# Patient Record
Sex: Female | Born: 1995 | Race: Black or African American | Hispanic: No | Marital: Single | Smoking: Never smoker
Health system: Southern US, Community
[De-identification: ages and names within clinical notes are randomized; demographics above are authoritative.]

## PROBLEM LIST (undated history)

## (undated) DIAGNOSIS — M1990 Unspecified osteoarthritis, unspecified site: Secondary | ICD-10-CM

## (undated) DIAGNOSIS — M329 Systemic lupus erythematosus, unspecified: Secondary | ICD-10-CM

## (undated) DIAGNOSIS — IMO0002 Reserved for concepts with insufficient information to code with codable children: Secondary | ICD-10-CM

## (undated) HISTORY — PX: NO PAST SURGERIES: SHX2092

---
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
## 2015-01-01 ENCOUNTER — Emergency Department (HOSPITAL_COMMUNITY): Payer: Medicaid Other

## 2015-01-01 ENCOUNTER — Encounter (HOSPITAL_COMMUNITY): Payer: Self-pay | Admitting: *Deleted

## 2015-01-01 ENCOUNTER — Emergency Department (HOSPITAL_COMMUNITY)
Admission: EM | Admit: 2015-01-01 | Discharge: 2015-01-02 | Disposition: A | Discharge status: Home / Self Care | Payer: Medicaid Other | Attending: Emergency Medicine | Admitting: Emergency Medicine

## 2015-01-01 DIAGNOSIS — R0789 Other chest pain: Secondary | ICD-10-CM | POA: Insufficient documentation

## 2015-01-01 DIAGNOSIS — R05 Cough: Secondary | ICD-10-CM | POA: Insufficient documentation

## 2015-01-01 DIAGNOSIS — R0602 Shortness of breath: Secondary | ICD-10-CM | POA: Insufficient documentation

## 2015-01-01 DIAGNOSIS — Z872 Personal history of diseases of the skin and subcutaneous tissue: Secondary | ICD-10-CM | POA: Diagnosis not present

## 2015-01-01 DIAGNOSIS — O9989 Other specified diseases and conditions complicating pregnancy, childbirth and the puerperium: Secondary | ICD-10-CM | POA: Insufficient documentation

## 2015-01-01 DIAGNOSIS — Z8739 Personal history of other diseases of the musculoskeletal system and connective tissue: Secondary | ICD-10-CM | POA: Diagnosis not present

## 2015-01-01 DIAGNOSIS — Z3A16 16 weeks gestation of pregnancy: Secondary | ICD-10-CM | POA: Insufficient documentation

## 2015-01-01 HISTORY — DX: Unspecified osteoarthritis, unspecified site: M19.90

## 2015-01-01 HISTORY — DX: Reserved for concepts with insufficient information to code with codable children: IMO0002

## 2015-01-01 HISTORY — DX: Systemic lupus erythematosus, unspecified: M32.9

## 2015-01-01 LAB — CBC WITH DIFFERENTIAL/PLATELET
BASOS PCT: 0 % (ref 0–1)
Basophils Absolute: 0 10*3/uL (ref 0.0–0.1)
Eosinophils Absolute: 0.1 10*3/uL (ref 0.0–0.7)
Eosinophils Relative: 1 % (ref 0–5)
HCT: 34.2 % — ABNORMAL LOW (ref 36.0–46.0)
HEMOGLOBIN: 11.8 g/dL — AB (ref 12.0–15.0)
Lymphocytes Relative: 32 % (ref 12–46)
Lymphs Abs: 2.5 10*3/uL (ref 0.7–4.0)
MCH: 31.2 pg (ref 26.0–34.0)
MCHC: 34.5 g/dL (ref 30.0–36.0)
MCV: 90.5 fL (ref 78.0–100.0)
MONO ABS: 0.8 10*3/uL (ref 0.1–1.0)
MONOS PCT: 10 % (ref 3–12)
NEUTROS ABS: 4.5 10*3/uL (ref 1.7–7.7)
NEUTROS PCT: 57 % (ref 43–77)
Platelets: 169 10*3/uL (ref 150–400)
RBC: 3.78 MIL/uL — ABNORMAL LOW (ref 3.87–5.11)
RDW: 13.9 % (ref 11.5–15.5)
WBC: 8 10*3/uL (ref 4.0–10.5)

## 2015-01-01 LAB — I-STAT CHEM 8, ED
BUN: 4 mg/dL — ABNORMAL LOW (ref 6–20)
CALCIUM ION: 1.27 mmol/L — AB (ref 1.12–1.23)
CHLORIDE: 101 mmol/L (ref 101–111)
Creatinine, Ser: 0.5 mg/dL (ref 0.44–1.00)
GLUCOSE: 117 mg/dL — AB (ref 65–99)
HCT: 36 % (ref 36.0–46.0)
Hemoglobin: 12.2 g/dL (ref 12.0–15.0)
POTASSIUM: 3 mmol/L — AB (ref 3.5–5.1)
Sodium: 137 mmol/L (ref 135–145)
TCO2: 22 mmol/L (ref 0–100)

## 2015-01-01 LAB — D-DIMER, QUANTITATIVE (NOT AT ARMC): D DIMER QUANT: 1.78 ug{FEU}/mL — AB (ref 0.00–0.48)

## 2015-01-01 MED ORDER — SODIUM CHLORIDE 0.9 % IV BOLUS (SEPSIS)
500.0000 mL | Freq: Once | INTRAVENOUS | Status: AC
  Administered 2015-01-01: 500 mL via INTRAVENOUS

## 2015-01-01 MED ORDER — IOHEXOL 350 MG/ML SOLN
100.0000 mL | Freq: Once | INTRAVENOUS | Status: AC | PRN
  Administered 2015-01-01: 100 mL via INTRAVENOUS

## 2015-01-01 MED ORDER — POTASSIUM CHLORIDE CRYS ER 20 MEQ PO TBCR
40.0000 meq | EXTENDED_RELEASE_TABLET | Freq: Once | ORAL | Status: AC
  Administered 2015-01-01: 40 meq via ORAL
  Filled 2015-01-01: qty 2

## 2015-01-01 NOTE — ED Notes (Signed)
Pt states that she is 4 almost 5 months pregnant. States that for 3 days she has been experiencing SOB. 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

## 2015-01-01 NOTE — ED Notes (Signed)
EKG completed in triage.

## 2015-01-01 NOTE — ED Provider Notes (Addendum)
CSN: [id number]     Arrival date & time 01/01/15  1831 History   First MD Initiated Contact with Patient 01/01/15 2152     Chief Complaint  Patient presents with  . Shortness of Breath     (Consider location/radiation/quality/duration/timing/severity/associated sxs/prior Treatment) Patient is a 19 y.o. female presenting with shortness of breath. The history is provided by the patient.  Shortness of Breath Severity:  Mild Onset quality:  Unable to specify Duration:  3 days Timing:  Constant Progression:  Unchanged Chronicity:  New Relieved by:  None tried Worsened by:  Nothing tried Ineffective treatments:  None tried Associated symptoms: chest pain and cough   Associated symptoms: no abdominal pain, no fever, no hemoptysis, no neck pain, no rash, no vomiting and no wheezing   Chest pain:    Quality:  Aching   Severity:  Mild   Onset quality:  Unable to specify   Duration:  3 days   Timing:  Constant   Progression:  Unchanged   Chronicity:  New   Past Medical History  Diagnosis Date  . Lupus   . Arthritis    History reviewed. No pertinent past surgical history. No family history on file. History  Substance Use Topics  . Smoking status: Never Smoker   . Smokeless tobacco: Not on file  . Alcohol Use: No   OB History    Gravida Para Term Preterm AB TAB SAB Ectopic Multiple Living   1              Review of Systems  Constitutional: Negative for fever.  Respiratory: Positive for cough and shortness of breath. Negative for hemoptysis and wheezing.   Cardiovascular: Positive for chest pain.  Gastrointestinal: Negative for nausea, vomiting, abdominal pain, diarrhea and constipation.  Genitourinary: Negative for vaginal bleeding and vaginal discharge.  Musculoskeletal: Negative for back pain and neck pain.  Skin: Negative for rash.  All other systems reviewed and are negative.     Allergies  Review of patient's allergies indicates no known allergies.  Home  Medications   Prior to Admission medications   Medication Sig Start Date End Date Taking? Authorizing Provider  Prenatal Vit-Fe Fumarate-FA (PREPLUS) 27-1 MG TABS Take 1 tablet by mouth daily. 12/05/14  Yes Historical Provider, MD   BP 98/49 mmHg  Pulse 72  Temp(Src) 98.5 F (36.9 C) (Oral)  Resp 22  SpO2 100%  LMP  Physical Exam  Constitutional: She appears well-developed.  HENT:  Head: Normocephalic.  Right Ear: External ear normal.  Left Ear: External ear normal.  Mouth/Throat: Oropharynx is clear and moist.  Eyes: Pupils are equal, round, and reactive to light.  Neck: Normal range of motion.  Pulmonary/Chest: Effort normal and breath sounds normal. No respiratory distress. She has no wheezes. She exhibits no tenderness.  Abdominal: Soft.  Musculoskeletal: Normal range of motion.  Neurological: She is alert.  Skin: Skin is warm and dry.  Vitals reviewed.   ED Course  Procedures (including critical care time) Labs Review Labs Reviewed  CBC WITH DIFFERENTIAL/PLATELET - Abnormal; Notable for the following:    RBC 3.78 (*)    Hemoglobin 11.8 (*)    HCT 34.2 (*)    All other components within normal limits  D-DIMER, QUANTITATIVE (NOT AT [facility]) - Abnormal; Notable for the following:    D-Dimer, Quant 1.78 (*)    All other components within normal limits  I-STAT CHEM 8, ED - Abnormal; Notable for the following:    Potassium 3.0 (*)  BUN 4 (*)    Glucose, Bld 117 (*)    Calcium, Ion 1.27 (*)    All other components within normal limits    Imaging Review Ct Angio Chest Pe W/cm &/or Wo Cm  01/02/2015   CLINICAL DATA:  Three days dyspnea.  History of lupus.  EXAM: CT ANGIOGRAPHY CHEST WITH CONTRAST  TECHNIQUE: Multidetector CT imaging of the chest was performed using the standard protocol during bolus administration of intravenous contrast. Multiplanar CT image reconstructions and MIPs were obtained to evaluate the vascular anatomy.  CONTRAST:  OMNIPAQUE IOHEXOL 350  MG/ML SOLN  COMPARISON:  None.  FINDINGS: Cardiovascular: There is good opacification of the pulmonary arteries. There is no pulmonary embolism. The thoracic aorta is normal in caliber and intact.  Lungs: Clear  Central airways: Patent  Effusions: None  Lymphadenopathy: None  Esophagus: Unremarkable  Upper abdomen: No significant abnormality  Musculoskeletal: No significant abnormality  Review of the MIP images confirms the above findings.  IMPRESSION: Negative for pulmonary embolism.  No significant abnormality.   Electronically Signed   By: Ellery Plunk M.D.   On: 01/02/2015 00:04     EKG Interpretation   Date/Time:  Tuesday January 01 2015 19:09:05 EDT Ventricular Rate:  98 PR Interval:  152 QRS Duration: 106 QT Interval:  348 QTC Calculation: 444 R Axis:   73 Text Interpretation:  Normal sinus rhythm Incomplete right bundle branch  block Nonspecific T wave abnormality Abnormal ECG No previous ECGs  available Confirmed by Bebe Shaggy  MD, Dorinda Hill ([id number]) on 01/01/2015 11:35:25  PM     Patient's d-dimer is elevated at 1.78, even corrected for pregnancy.  It is elevated, she will be set up for a chest CT anterior to rule out PE abdomen will be shielded MDM   Final diagnoses:  Shortness of breath  Other chest pain         Earley Favor, NP 01/03/15 1954  Mancel Bale, MD 01/04/15 6045  Earley Favor, NP 01/16/15 2008  Mancel Bale, MD 01/19/15 [id number]

## 2015-01-02 MED ORDER — ACETAMINOPHEN 325 MG PO TABS
650.0000 mg | ORAL_TABLET | Freq: Once | ORAL | Status: AC
  Administered 2015-01-02: 650 mg via ORAL
  Filled 2015-01-02: qty 2

## 2015-01-02 NOTE — ED Notes (Signed)
Called [facility] Heightewed bp of 87/80m, and patient still having chest pain. Received bolus. She acknowledges, gives verbal order for tylenol.

## 2015-01-02 NOTE — Discharge Instructions (Signed)
Chest Pain (Nonspecific) It is often hard to give a diagnosis for the cause of chest pain. There is always a chance that your pain could be related to something serious, such as a heart attack or a blood clot in the lungs. You need to follow up with your doctor. HOME CARE  If antibiotic medicine was given, take it as directed by your doctor. Finish the medicine even if you start to feel better.  For the next few days, avoid activities that bring on chest pain. Continue physical activities as told by your doctor.  Do not use any tobacco products. This includes cigarettes, chewing tobacco, and e-cigarettes.  Avoid drinking alcohol.  Only take medicine as told by your doctor.  Follow your doctor's suggestions for more testing if your chest pain does not go away.  Keep all doctor visits you made. GET HELP IF:  Your chest pain does not go away, even after treatment.  You have a rash with blisters on your chest.  You have a fever. GET HELP RIGHT AWAY IF:   You have more pain or pain that spreads to your arm, neck, jaw, back, or belly (abdomen).  You have shortness of breath.  You cough more than usual or cough up blood.  You have very bad back or belly pain.  You feel sick to your stomach (nauseous) or throw up (vomit).  You have very bad weakness.  You pass out (faint).  You have chills. This is an emergency. Do not wait to see if the problems will go away. Call your local emergency services (911 in [place] Do not drive yourself to the hospital. MAKE SURE YOU:   Understand these instructions.  Will watch your condition.  Will get help right away if you are not doing well or get worse. Document Released: 11/11/2007 Document Revised: 05/30/2013 Document Reviewed: 11/11/2007 [facility] Patient Information 2015 [place], [place]. This information is not intended to replace advice given to you by your health care provider. Make sure you discuss any questions you have with your  health care provider. Today you were evaluated for your chest discomfort/pain and shortness of breath.  Your chest x-ray is normal.  You will ruled out for pulmonary embolus.  He been given Tylenol for discomfort.  Please make an appointment with your primary care physician for further evaluation

## 2015-01-02 NOTE — ED Notes (Signed)
Attempted to call Dondra Spry, NP, to report bp and request for pain meds.

## 2015-01-16 ENCOUNTER — Other Ambulatory Visit (HOSPITAL_COMMUNITY): Payer: Self-pay | Admitting: Obstetrics and Gynecology

## 2015-01-16 DIAGNOSIS — Z3A18 18 weeks gestation of pregnancy: Secondary | ICD-10-CM

## 2015-01-16 DIAGNOSIS — O09299 Supervision of pregnancy with other poor reproductive or obstetric history, unspecified trimester: Secondary | ICD-10-CM

## 2015-01-16 DIAGNOSIS — O09219 Supervision of pregnancy with history of pre-term labor, unspecified trimester: Secondary | ICD-10-CM

## 2015-01-22 ENCOUNTER — Ambulatory Visit (HOSPITAL_COMMUNITY)
Admission: RE | Admit: 2015-01-22 | Discharge: 2015-01-22 | Disposition: A | Discharge status: Home / Self Care | Payer: Medicaid Other | Source: Ambulatory Visit | Attending: Obstetrics and Gynecology | Admitting: Obstetrics and Gynecology

## 2015-01-22 ENCOUNTER — Encounter (HOSPITAL_COMMUNITY): Payer: Self-pay

## 2015-01-22 DIAGNOSIS — Z36 Encounter for antenatal screening of mother: Secondary | ICD-10-CM | POA: Insufficient documentation

## 2015-01-22 DIAGNOSIS — O09299 Supervision of pregnancy with other poor reproductive or obstetric history, unspecified trimester: Secondary | ICD-10-CM | POA: Insufficient documentation

## 2015-01-22 DIAGNOSIS — Z3A18 18 weeks gestation of pregnancy: Secondary | ICD-10-CM

## 2015-01-22 DIAGNOSIS — Z3A17 17 weeks gestation of pregnancy: Secondary | ICD-10-CM | POA: Diagnosis not present

## 2015-01-22 DIAGNOSIS — O09219 Supervision of pregnancy with history of pre-term labor, unspecified trimester: Secondary | ICD-10-CM | POA: Insufficient documentation

## 2015-01-22 DIAGNOSIS — O352XX0 Maternal care for (suspected) hereditary disease in fetus, not applicable or unspecified: Secondary | ICD-10-CM | POA: Diagnosis not present

## 2015-01-22 NOTE — Consult Note (Signed)
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 Ferdinand Cava for fetal anatomic survey and consult.  Ultrasound today shows: Single intrauterine pregnancy. Fetal biometry is consistent with dating. Posterior placenta without evidence of previa. Normal amniotic fluid volume. Normal transabdominal cervical length. Normal fetal anatomic survey.  I counseled the patient as follows: 1. Appropriate fetal growth. 2. Normal fetal anatomic survey. 3. Previous fetus with renal agenesis/fetal demise: - discussed etiology is likely sporadic but inheritable forms have been described and cases of X linked, autosomal recessive, and autosomal dominant have all been described - discussed recurrence risk is 3-6% - normal anatomic survey today and normal fluid so this fetus is not affected - other kidney malformations can run in the family including unilateral renal agenesis - fetal demise from previous pregnancy felt to be due to renal agenesis and anhydramnios 4. Cutaneous lupus: - patient follows with Dermatology - is unsure if has ever been evaluated for systemic lupus - does report arthritis - I recommend referral to Rheumatology for evaluation for systemic lupus - if there is concern for systemic lupus recommend order ssA, ssB, lupus anticoagulant, anticardiolipin antibodies, and beta 2 glycoprotein - would recommend order baseline CMP and CBC - if concern for systemic lupus would monitor closely for preeclampsia, fetal growth every 4 weeks, and antenatal testing at 32 weeks - if deemed only cutaneous lupus would recommend serial fetal growth in the third trimester but no other increased surveillance is indicated 5. Recommend ultrasound for fetal growth around 26-28 weeks 6. Patient reports had aneuploidy screening with primary OB- we do not have results  I spent a total of 30 minutes with the patient of which >50% was in  face to face consultation. Please call with questions.  Eulis Foster, MD

## 2015-01-24 ENCOUNTER — Other Ambulatory Visit (HOSPITAL_COMMUNITY): Payer: Self-pay | Admitting: Obstetrics and Gynecology

## 2015-01-30 ENCOUNTER — Encounter (HOSPITAL_COMMUNITY): Payer: Self-pay | Admitting: Obstetrics and Gynecology

## 2015-11-27 ENCOUNTER — Encounter (HOSPITAL_COMMUNITY): Payer: Self-pay | Admitting: *Deleted

## 2016-09-02 IMAGING — CT CT ANGIO CHEST
2 of 9 series · 19 of 46 positions shown · IV contrast (omnipaque)
Comparison: None.

CLINICAL DATA: Three days dyspnea.  History of lupus.

EXAM:
CT ANGIOGRAPHY CHEST WITH CONTRAST
TECHNIQUE: Multidetector CT imaging of the chest was performed using the
standard protocol during bolus administration of intravenous
contrast. Multiplanar CT image reconstructions and MIPs were
obtained to evaluate the vascular anatomy.
CONTRAST:  100mL OMNIPAQUE IOHEXOL 350 MG/ML SOLN

[Series 5: thins · axial · 0.65mm/px · z∈[+1216,+1437]mm · 16 of 249 slices shown]
[im 14/249  lung]
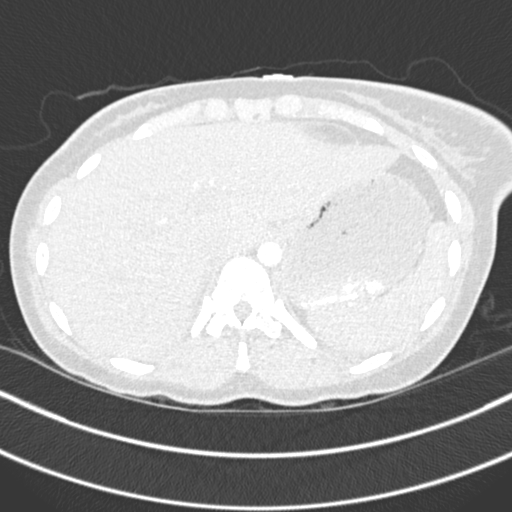
[im 28/249  soft-tissue]
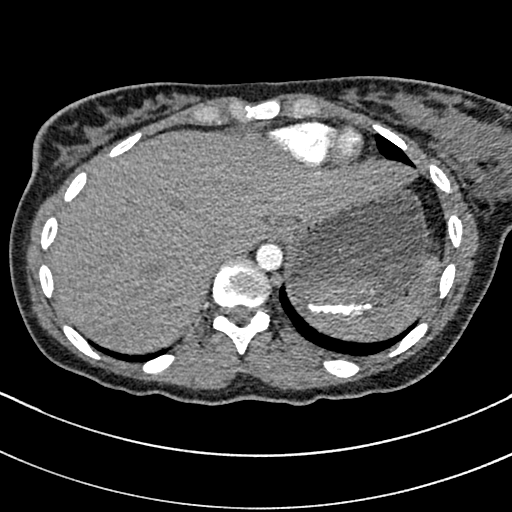
[im 42/249  lung]
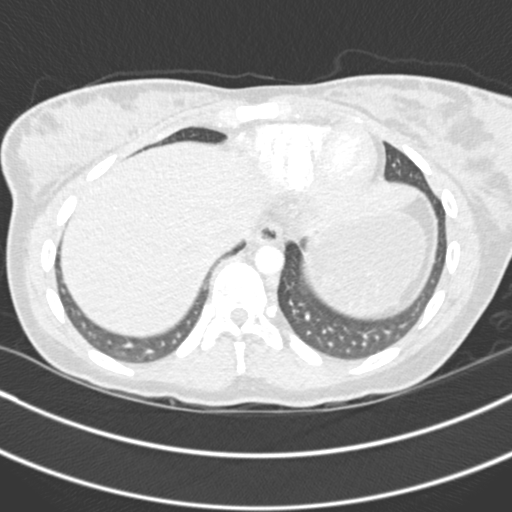
[im 56/249  soft-tissue]
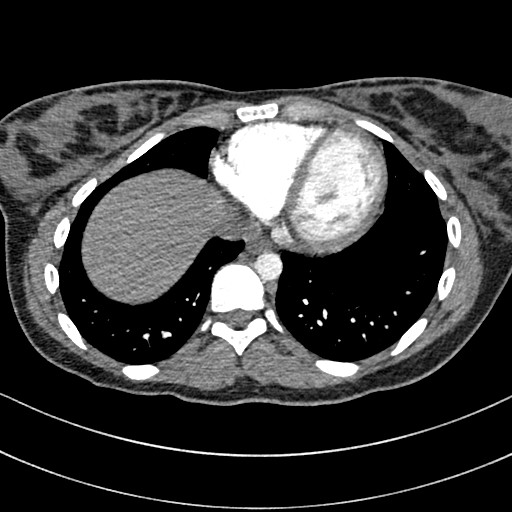
[im 69/249  lung]
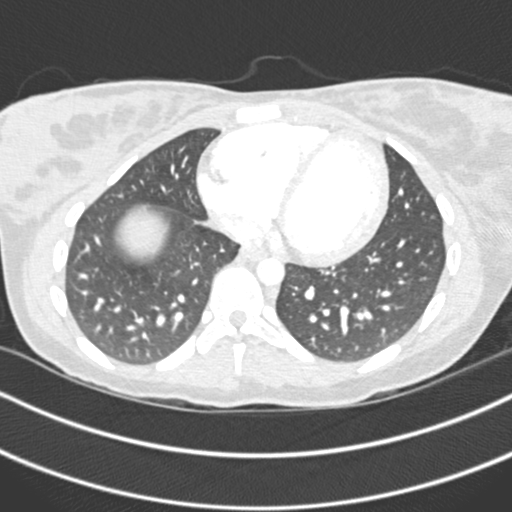
[im 83/249  soft-tissue]
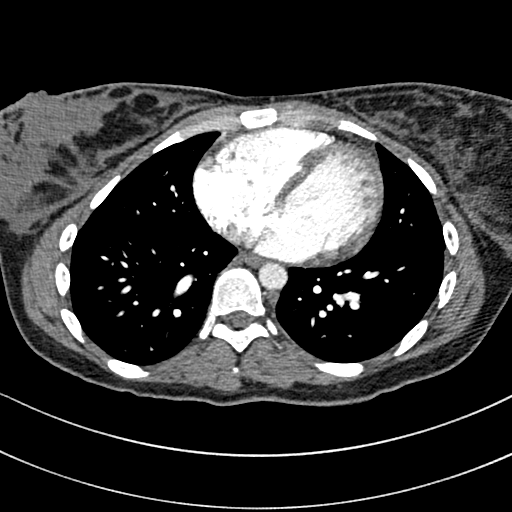
[im 97/249  lung]
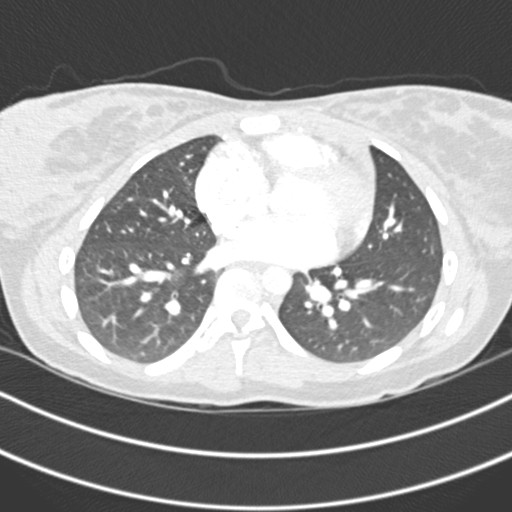
[im 111/249  soft-tissue]
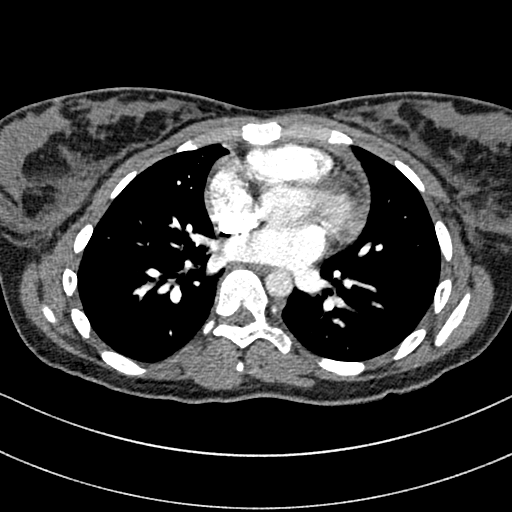
[im 138/249  lung]
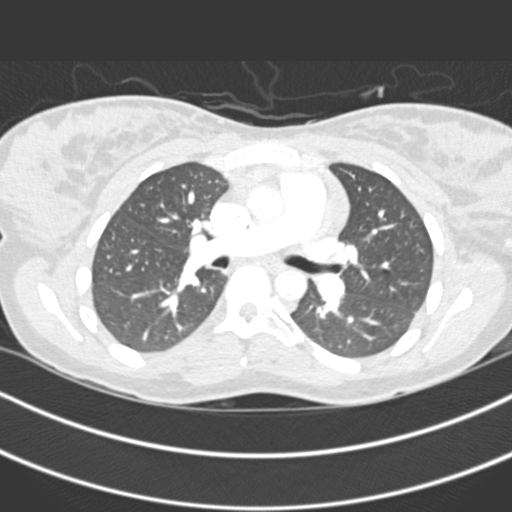
[im 152/249  soft-tissue]
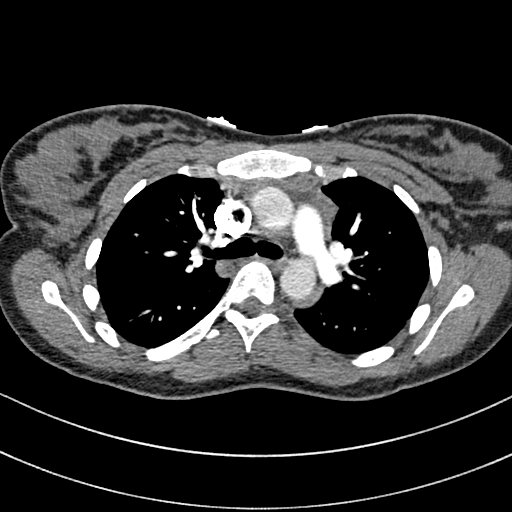
[im 166/249  lung]
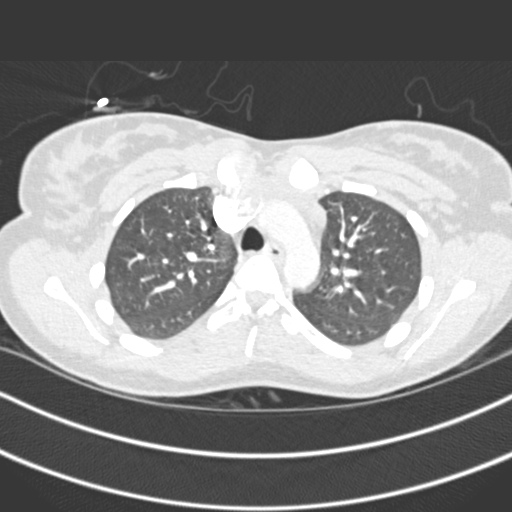
[im 180/249  soft-tissue]
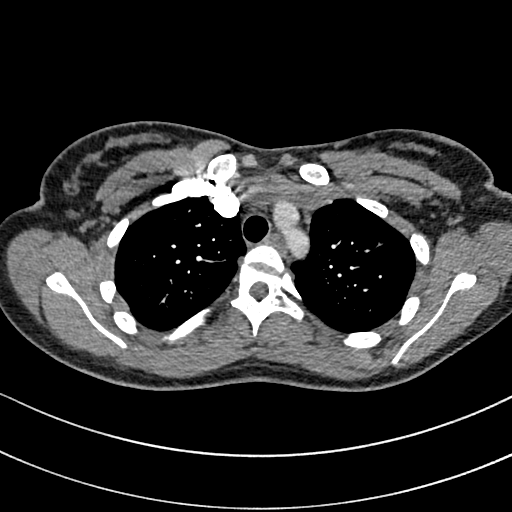
[im 193/249  lung]
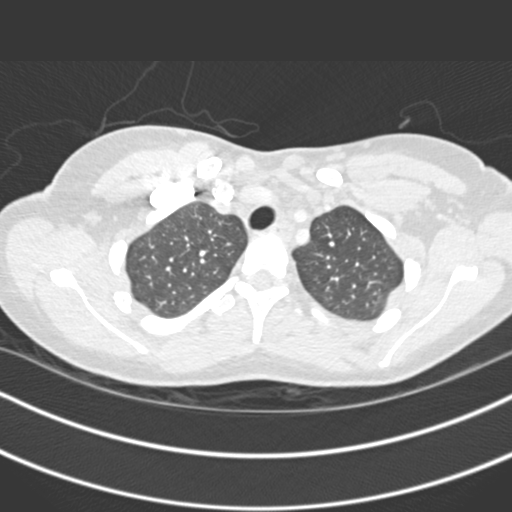
[im 207/249  soft-tissue]
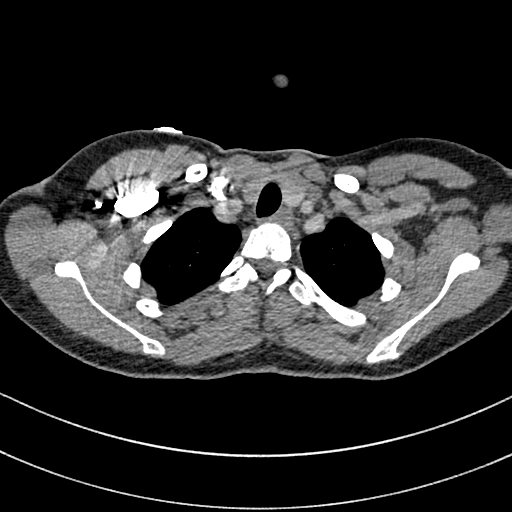
[im 221/249  lung]
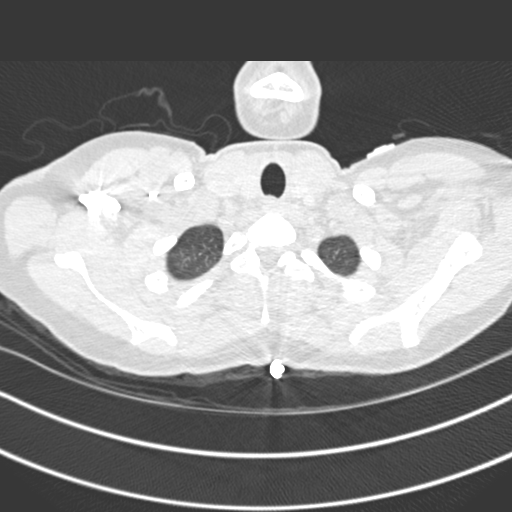
[im 235/249  soft-tissue]
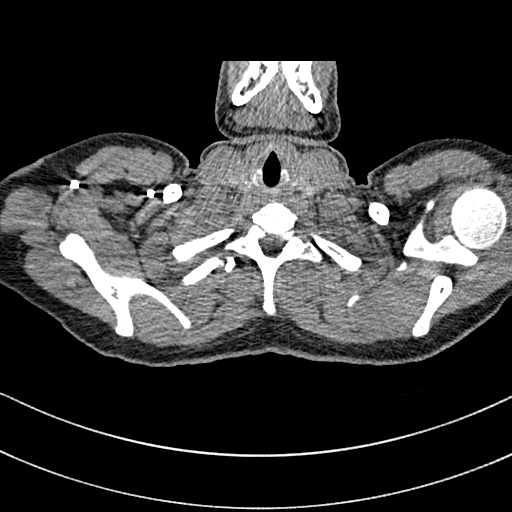

[Series 7: coronal mpr · coronal · 0.51mm/px · 3 of 93 slices shown]
[im 24/93  soft-tissue]
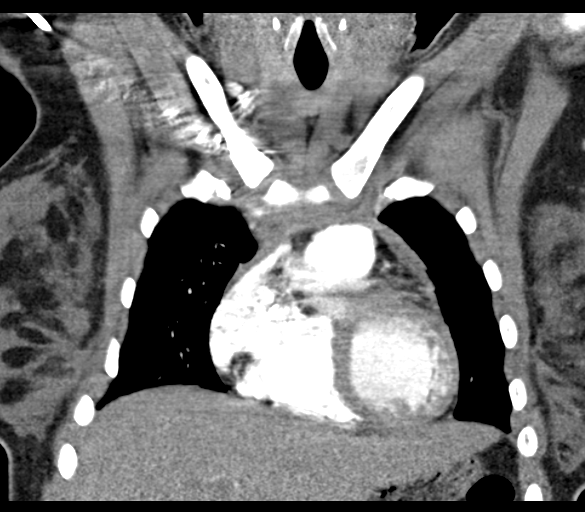
[im 47/93  soft-tissue]
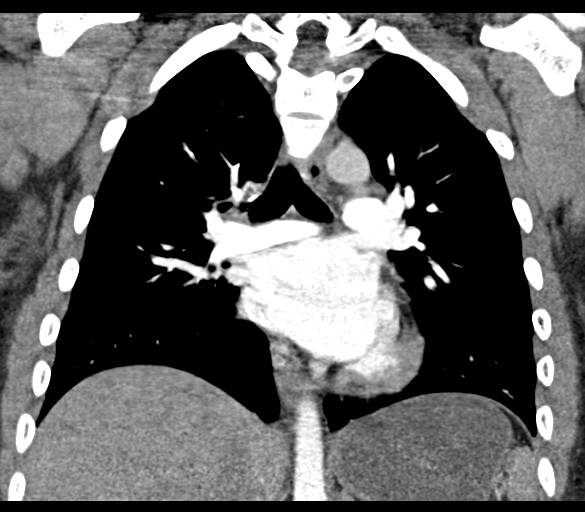
[im 70/93  soft-tissue]
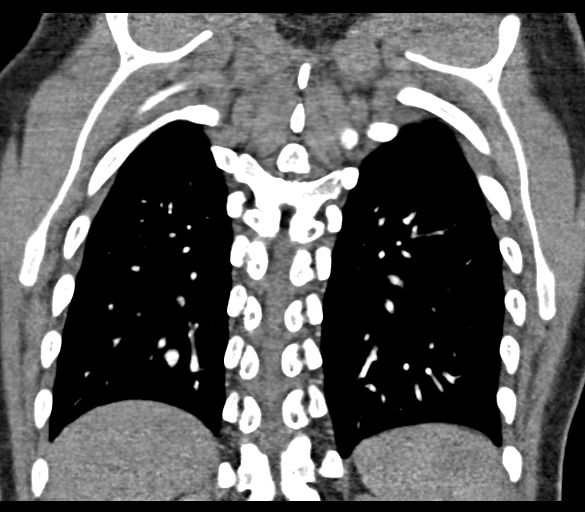

[19 of 46 positions shown; findings below may reference images not displayed]

FINDINGS: Cardiovascular: There is good opacification of the pulmonary
arteries. There is no pulmonary embolism. The thoracic aorta is
normal in caliber and intact.

Lungs: Clear

Central airways: Patent

Effusions: None

Lymphadenopathy: None

Esophagus: Unremarkable

Upper abdomen: No significant abnormality

Musculoskeletal: No significant abnormality

Review of the MIP images confirms the above findings.
IMPRESSION: Negative for pulmonary embolism.  No significant abnormality.

## 2022-07-27 ENCOUNTER — Ambulatory Visit (INDEPENDENT_AMBULATORY_CARE_PROVIDER_SITE_OTHER): Payer: Medicaid Other

## 2022-07-27 ENCOUNTER — Ambulatory Visit (HOSPITAL_COMMUNITY)
Admission: EM | Admit: 2022-07-27 | Discharge: 2022-07-27 | Disposition: A | Discharge status: Home / Self Care | Payer: Medicaid Other | Attending: Family Medicine | Admitting: Family Medicine

## 2022-07-27 ENCOUNTER — Other Ambulatory Visit: Payer: Self-pay

## 2022-07-27 ENCOUNTER — Encounter (HOSPITAL_COMMUNITY): Payer: Self-pay | Admitting: *Deleted

## 2022-07-27 DIAGNOSIS — Z1152 Encounter for screening for COVID-19: Secondary | ICD-10-CM | POA: Diagnosis not present

## 2022-07-27 DIAGNOSIS — R051 Acute cough: Secondary | ICD-10-CM | POA: Insufficient documentation

## 2022-07-27 DIAGNOSIS — J069 Acute upper respiratory infection, unspecified: Secondary | ICD-10-CM | POA: Diagnosis not present

## 2022-07-27 DIAGNOSIS — H9209 Otalgia, unspecified ear: Secondary | ICD-10-CM | POA: Diagnosis not present

## 2022-07-27 DIAGNOSIS — M329 Systemic lupus erythematosus, unspecified: Secondary | ICD-10-CM | POA: Diagnosis not present

## 2022-07-27 DIAGNOSIS — R0602 Shortness of breath: Secondary | ICD-10-CM | POA: Insufficient documentation

## 2022-07-27 MED ORDER — BENZONATATE 100 MG PO CAPS: 100.0000 mg | ORAL_CAPSULE | Freq: Three times a day (TID) | ORAL | 0 refills | Status: AC | PRN

## 2022-07-27 NOTE — Discharge Instructions (Signed)
Your chest x-ray does not show any pneumonia or fluid  Take benzonatate 100 mg, 1 tab every 8 hours as needed for cough.  You can also take Mucinex D as needed for the congestion.   You have been swabbed for COVID, and the test will result in the next 24 hours. Our staff will call you if positive. If the COVID test is positive, you should quarantine for 5 days from the start of your symptoms.  On days 6-10 from the start of your illness, you should wear a mask if out in public.

## 2022-07-27 NOTE — ED Provider Notes (Signed)
[place]    CSN: [id number] Arrival date & time: 07/27/22  [id number]      History   Chief Complaint Chief Complaint  Patient presents with   Nasal Congestion   Shortness of Breath   Muscle Pain   Otalgia    HPI Lori Dunlap is a 27 y.o. female.    Shortness of Breath Associated symptoms: ear pain   Muscle Pain Associated symptoms include shortness of breath.  Otalgia  Here for cough and congestion.  Symptoms began February 15 mainly with a little tightness in her chest.  Then the next day she began having postnasal drainage and nasal congestion.  Her ears have been stopping up and hurting some.  She is also been coughing.  No fever or chills.  No nausea or vomiting or diarrhea.  She is on Depo-Provera injection.  Past Medical History:  Diagnosis Date   Arthritis    Lupus ([place])     There are no problems to display for this patient.   Past Surgical History:  Procedure Laterality Date   NO PAST SURGERIES      OB History     Gravida  2   Para  1   Term      Preterm  1   AB      Living  0      SAB      IAB      Ectopic      Multiple      Live Births               Home Medications    Prior to Admission medications   Medication Sig Start Date End Date Taking? Authorizing Provider  benzonatate (TESSALON) 100 MG capsule Take 1 capsule (100 mg total) by mouth 3 (three) times daily as needed for cough. 07/27/22  Yes Barrett Henle, MD  Prenatal Vit-Fe Fumarate-FA (PREPLUS) 27-1 MG TABS Take 1 tablet by mouth daily. 12/05/14   [provider]    Family History History reviewed. No pertinent family history.  Social History Social History   Tobacco Use   Smoking status: Never  Substance Use Topics   Alcohol use: No   Drug use: No     Allergies   Patient has no known allergies.   Review of Systems Review of Systems  HENT:  Positive for ear pain.   Respiratory:  Positive for shortness of breath.       Physical Exam Triage Vital Signs ED Triage Vitals  Enc Vitals Group     BP 07/27/22 1101 108/70     Pulse Rate 07/27/22 1101 80     Resp 07/27/22 1101 18     Temp 07/27/22 1101 98 F (36.7 C)     Temp src --      SpO2 07/27/22 1101 92 %     Weight --      Height --      Head Circumference --      Peak Flow --      Pain Score 07/27/22 1059 7     Pain Loc --      Pain Edu? --      Excl. in [place]? --    No data found.  Updated Vital Signs BP 108/70   Pulse 80   Temp 98 F (36.7 C)   Resp 18   SpO2 92%   Visual Acuity Right Eye Distance:   Left Eye Distance:   Bilateral Distance:  Right Eye Near:   Left Eye Near:    Bilateral Near:     Physical Exam Vitals reviewed.  Constitutional:      General: She is not in acute distress.    Appearance: She is not ill-appearing, toxic-appearing or diaphoretic.  HENT:     Right Ear: Ear canal normal.     Left Ear: Ear canal normal.     Ears:     Comments: Bilateral TM's are obscured by cerumen    Nose: Nose normal.     Mouth/Throat:     Mouth: Mucous membranes are moist.     Pharynx: No oropharyngeal exudate or posterior oropharyngeal erythema.  Eyes:     Extraocular Movements: Extraocular movements intact.     Conjunctiva/sclera: Conjunctivae normal.     Pupils: Pupils are equal, round, and reactive to light.  Cardiovascular:     Rate and Rhythm: Normal rate and regular rhythm.     Heart sounds: No murmur heard. Pulmonary:     Effort: Pulmonary effort is normal. No respiratory distress.     Breath sounds: No stridor. No wheezing, rhonchi or rales.  Musculoskeletal:     Cervical back: Neck supple.  Lymphadenopathy:     Cervical: No cervical adenopathy.  Skin:    Capillary Refill: Capillary refill takes less than 2 seconds.     Coloration: Skin is not jaundiced or pale.  Neurological:     General: No focal deficit present.     Mental Status: She is alert and oriented to person, place, and time.   Psychiatric:        Behavior: Behavior normal.      UC Treatments / Results  Labs (all labs ordered are listed, but only abnormal results are displayed) Labs Reviewed  SARS CORONAVIRUS 2 (TAT 6-24 HRS)    EKG   Radiology DG Chest 2 View  Result Date: 07/27/2022 CLINICAL DATA:  Provided history: Shortness of breath. URI symptoms. History of SLE. EXAM: CHEST - 2 VIEW COMPARISON:  Chest radiographs 05/14/2017. FINDINGS: Heart size within normal limits. No appreciable airspace consolidation. No evidence of pleural effusion or pneumothorax. No acute osseous abnormality identified. 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 IMPRESSION: No evidence of active cardiopulmonary disease. 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 Electronically Signed   By: Kellie Simmering D.O.   On: 07/27/2022 12:18    Procedures Procedures (including critical care time)  Medications Ordered in UC Medications - No data to display  Initial Impression / Assessment and Plan / UC Course  I have reviewed the triage vital signs and the nursing notes.  Pertinent labs & imaging results that were available during my care of the patient were reviewed by me and considered in my medical decision making (see chart for details).        Chest x-ray is clear.  Tessalon Perles are sent for symptomatic treatment.  COVID swab was done today and if positive she is a candidate for Paxlovid.  Her last EGFR in June 2023 was greater than 90.  This is found in the care everywhere tab Final Clinical Impressions(s) / UC Diagnoses   Final diagnoses:  Viral upper respiratory tract infection  Acute cough     Discharge Instructions      Your chest x-ray does not show any pneumonia or fluid  Take benzonatate 100 mg, 1 tab every 8 hours as needed for cough.  You can also take Mucinex D as needed for the congestion.  You have been swabbed for COVID, and the test will result in the next 24 hours. Our  staff will call you if positive. If the COVID test is positive, you should quarantine for 5 days from the start of your symptoms.  On days 6-10 from the start of your illness, you should wear a mask if out in public.      ED Prescriptions     Medication Sig Dispense Auth. Provider   benzonatate (TESSALON) 100 MG capsule Take 1 capsule (100 mg total) by mouth 3 (three) times daily as needed for cough. 21 capsule Barrett Henle, MD      PDMP not reviewed this encounter.   Barrett Henle, MD 07/27/22 1228

## 2022-07-27 NOTE — ED Triage Notes (Signed)
Pt reports her runny nose, muscle pain and SHOB started on Thursday.

## 2022-07-28 LAB — SARS CORONAVIRUS 2 (TAT 6-24 HRS): SARS Coronavirus 2: NEGATIVE

## 2024-06-05 ENCOUNTER — Other Ambulatory Visit: Payer: Self-pay

## 2024-06-05 ENCOUNTER — Encounter (HOSPITAL_BASED_OUTPATIENT_CLINIC_OR_DEPARTMENT_OTHER): Payer: Self-pay | Admitting: Emergency Medicine

## 2024-06-05 ENCOUNTER — Emergency Department (HOSPITAL_BASED_OUTPATIENT_CLINIC_OR_DEPARTMENT_OTHER)
Admission: EM | Admit: 2024-06-05 | Discharge: 2024-06-05 | Disposition: A | Discharge status: Home / Self Care | Attending: Emergency Medicine | Admitting: Emergency Medicine

## 2024-06-05 DIAGNOSIS — M329 Systemic lupus erythematosus, unspecified: Secondary | ICD-10-CM

## 2024-06-05 DIAGNOSIS — M3210 Systemic lupus erythematosus, organ or system involvement unspecified: Secondary | ICD-10-CM | POA: Diagnosis present

## 2024-06-05 LAB — CBC WITH DIFFERENTIAL/PLATELET
Abs Immature Granulocytes: 0.01 K/uL (ref 0.00–0.07)
Basophils Absolute: 0.1 K/uL (ref 0.0–0.1)
Basophils Relative: 1 %
Eosinophils Absolute: 0.1 K/uL (ref 0.0–0.5)
Eosinophils Relative: 1 %
HCT: 40.9 % (ref 36.0–46.0)
Hemoglobin: 13.5 g/dL (ref 12.0–15.0)
Immature Granulocytes: 0 %
Lymphocytes Relative: 48 %
Lymphs Abs: 3 K/uL (ref 0.7–4.0)
MCH: 29.7 pg (ref 26.0–34.0)
MCHC: 33 g/dL (ref 30.0–36.0)
MCV: 90.1 fL (ref 80.0–100.0)
Monocytes Absolute: 0.7 K/uL (ref 0.1–1.0)
Monocytes Relative: 11 %
Neutro Abs: 2.4 K/uL (ref 1.7–7.7)
Neutrophils Relative %: 39 %
Platelets: 231 K/uL (ref 150–400)
RBC: 4.54 MIL/uL (ref 3.87–5.11)
RDW: 13.4 % (ref 11.5–15.5)
WBC: 6.2 K/uL (ref 4.0–10.5)
nRBC: 0 % (ref 0.0–0.2)

## 2024-06-05 LAB — BASIC METABOLIC PANEL WITH GFR
Anion gap: 11 (ref 5–15)
BUN: 9 mg/dL (ref 6–20)
CO2: 27 mmol/L (ref 22–32)
Calcium: 9.9 mg/dL (ref 8.9–10.3)
Chloride: 101 mmol/L (ref 98–111)
Creatinine, Ser: 0.76 mg/dL (ref 0.44–1.00)
GFR, Estimated: >60 mL/min
Glucose, Bld: 119 mg/dL — ABNORMAL HIGH (ref 70–99)
Potassium: 3.4 mmol/L — ABNORMAL LOW (ref 3.5–5.1)
Sodium: 139 mmol/L (ref 135–145)

## 2024-06-05 LAB — CK: Total CK: 185 U/L (ref 38–234)

## 2024-06-05 MED ORDER — TRIAMCINOLONE ACETONIDE 0.1 % EX CREA: 1.0000 | TOPICAL_CREAM | Freq: Two times a day (BID) | CUTANEOUS | 0 refills | Status: AC

## 2024-06-05 MED ORDER — PREDNISONE 10 MG (21) PO TBPK: ORAL_TABLET | Freq: Every day | ORAL | 0 refills | Status: AC

## 2024-06-05 NOTE — ED Triage Notes (Signed)
 Lupus flare up Pain tingles in hand X 1 week getting worse Not on any meds

## 2024-06-05 NOTE — ED Provider Triage Note (Signed)
 Emergency Medicine Provider Triage Evaluation Note  Lori Dunlap , a 28 y.o. female  was evaluated in triage.  Pt complains of lupus flare.  Reports that she has had generalized weakness, fatigue for several days.  Reports that that she had erythema around her hairline last night as well as burning pain that transition to flaky dry skin today, which she believes is a symptom of lupus.  She is not currently on any immunomodulators, previously was on hydroxychloroquine and prednisone , however has not been on medications for years.  Review of Systems  Positive: Generalized weakness, fatigue Negative: Fevers, chills  Physical Exam  There were no vitals taken for this visit. Gen:   Awake, no distress   Resp:  Normal effort  MSK:   Moves extremities without difficulty thank you Other:  Dry skin around hairline of frontal scalp  Medical Decision Making  Medically screening exam initiated at 11:05 AM.  Appropriate orders placed.  Lori Dunlap was informed that the remainder of the evaluation will be completed by another provider, this initial triage assessment does not replace that evaluation, and the importance of remaining in the ED until their evaluation is complete.    Lori Jerilynn RAMAN, MD 06/05/24 1106

## 2024-06-05 NOTE — Discharge Instructions (Signed)
 You were seen in the emerged from for lupus flare Your blood work looks okay We called in a refill for triamcinolone and called in a dose of steroids (Sterapred taper) to help with your lupus symptoms Follow-up with your primary doctor within 1 week for reevaluation and call your rheumatologist and let them know of your ED visit today Return to the emergency department for chest pain trouble breathing or other concerns

## 2024-06-05 NOTE — ED Provider Notes (Signed)
 " [place] EMERGENCY DEPARTMENT AT [facility] Provider Note   CSN: [id number] Arrival date & time: 06/05/24  1034     Patient presents with: No chief complaint on file.   Lori Dunlap is a 28 y.o. female.  Who with history of lupus who presents for reported lupus flare.  Arthritic pain throughout her hands and feet as well as dry flaky skin around her scalp consistent with prior lupus flares.  Cannot get into see her rheumatologist until August.  No fevers chills chest pain shortness of breath or other complaints at this time   HPI     Prior to Admission medications  Medication Sig Start Date End Date Taking? Authorizing Provider  predniSONE  (STERAPRED UNI-PAK 21 TAB) 10 MG (21) TBPK tablet Take by mouth daily. Take 6 tabs by mouth daily  for 2 days, then 5 tabs for 2 days, then 4 tabs for 2 days, then 3 tabs for 2 days, 2 tabs for 2 days, then 1 tab by mouth daily for 2 days 06/05/24  Yes Pamella Sharper A, DO  benzonatate  (TESSALON ) 100 MG capsule Take 1 capsule (100 mg total) by mouth 3 (three) times daily as needed for cough. 07/27/22   Vonna Sharlet POUR, MD  Prenatal Vit-Fe Fumarate-FA (PREPLUS) 27-1 MG TABS Take 1 tablet by mouth daily. 12/05/14   [provider]  triamcinolone cream (KENALOG) 0.1 % Apply 1 Application topically 2 (two) times daily. 06/05/24  Yes Pamella Sharper LABOR, DO    Allergies: Patient has no known allergies.    Review of Systems  Updated Vital Signs BP 117/72 (BP Location: Right Arm)   Pulse 85   Temp 99 F (37.2 C) (Oral)   Resp 18   LMP  (Approximate)   SpO2 93%   Physical Exam Vitals and nursing note reviewed.  HENT:     Head: Normocephalic and atraumatic.  Eyes:     Pupils: Pupils are equal, round, and reactive to light.  Cardiovascular:     Rate and Rhythm: Normal rate and regular rhythm.  Pulmonary:     Effort: Pulmonary effort is normal.     Breath sounds: Normal breath sounds.  Abdominal:     Palpations: Abdomen  is soft.     Tenderness: There is no abdominal tenderness.  Skin:    General: Skin is warm and dry.     Comments: Dry eczematous rash along right frontal scalp with flaking  Neurological:     Mental Status: She is alert.  Psychiatric:        Mood and Affect: Mood normal.     (all labs ordered are listed, but only abnormal results are displayed) Labs Reviewed  BASIC METABOLIC PANEL WITH GFR - Abnormal; Notable for the following components:      Result Value   Potassium 3.4 (*)    Glucose, Bld 119 (*)    All other components within normal limits  CBC WITH DIFFERENTIAL/PLATELET  CK    EKG: None  Radiology: No results found.   Procedures   Medications Ordered in the ED - No data to display                                  Medical Decision Making 28 year old female presenting for reported lupus flare.  Laboratory workup including renal function unremarkable.  No other symptoms aside from arthritic pain in both hands and feet and very dry skin.  She is requesting refill for triamcinolone which has helped with lupus related rashes in the past.  Will give her Sterapred taper instruct for PCP follow-up.  She will follow-up with rheumatology but is unable to get an appointment until August  Risk Prescription drug management.        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
